# Patient Record
Sex: Male | Born: 1975 | Race: Black or African American | Hispanic: No | Marital: Married | Smoking: Never smoker
Health system: Southern US, Community
[De-identification: ages and names within clinical notes are randomized; demographics above are authoritative.]

---

## 2006-11-30 ENCOUNTER — Emergency Department (HOSPITAL_COMMUNITY): Admission: EM | Admit: 2006-11-30 | Discharge: 2006-11-30 | Payer: Self-pay | Admitting: Emergency Medicine

## 2008-11-04 IMAGING — CR DG CHEST 2V
2 series · 2 of 2 positions shown · non-contrast
Comparison: No comparison.

CLINICAL DATA: Fever and chills.
 CHEST - 2 VIEW:

[w chest pa]
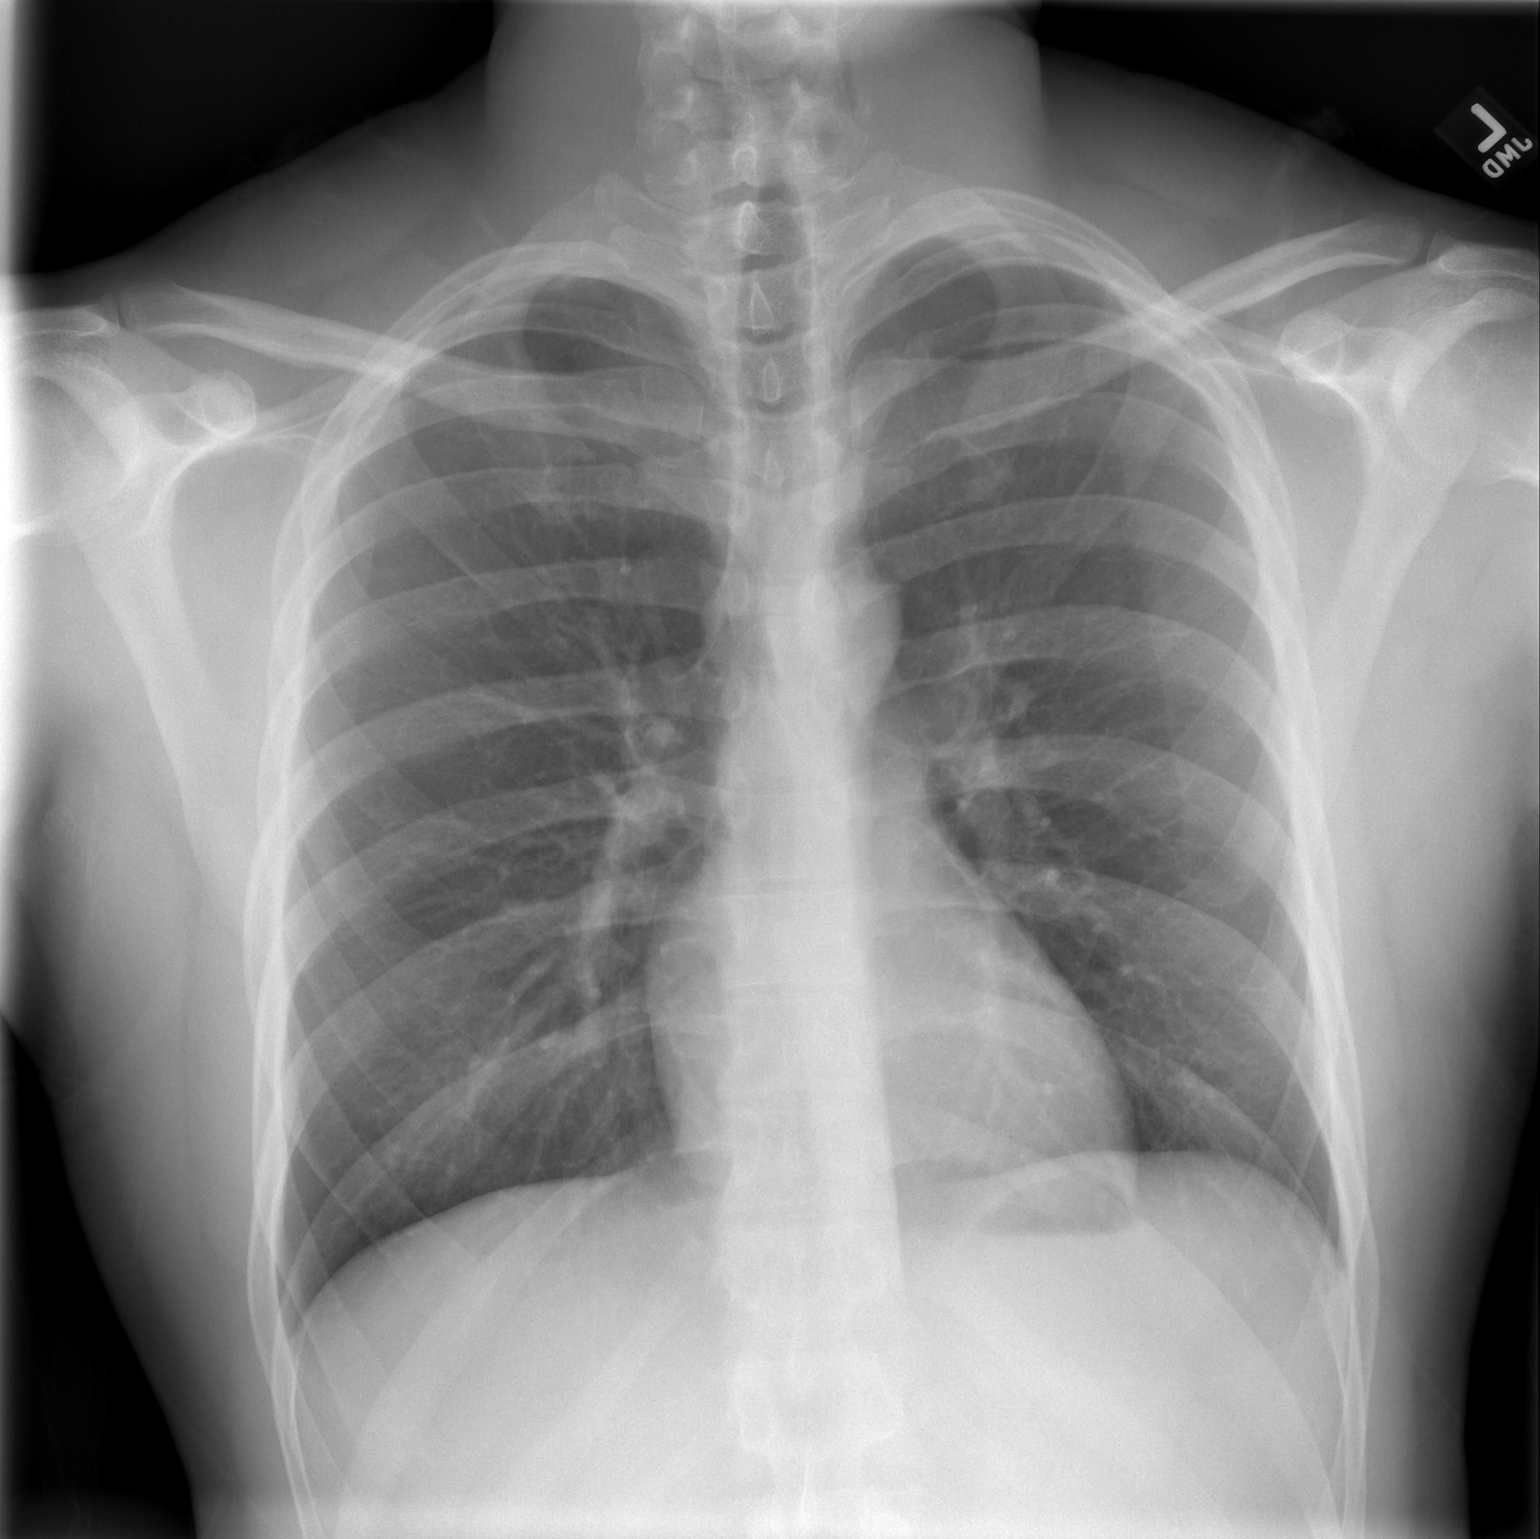

[w chest lat]
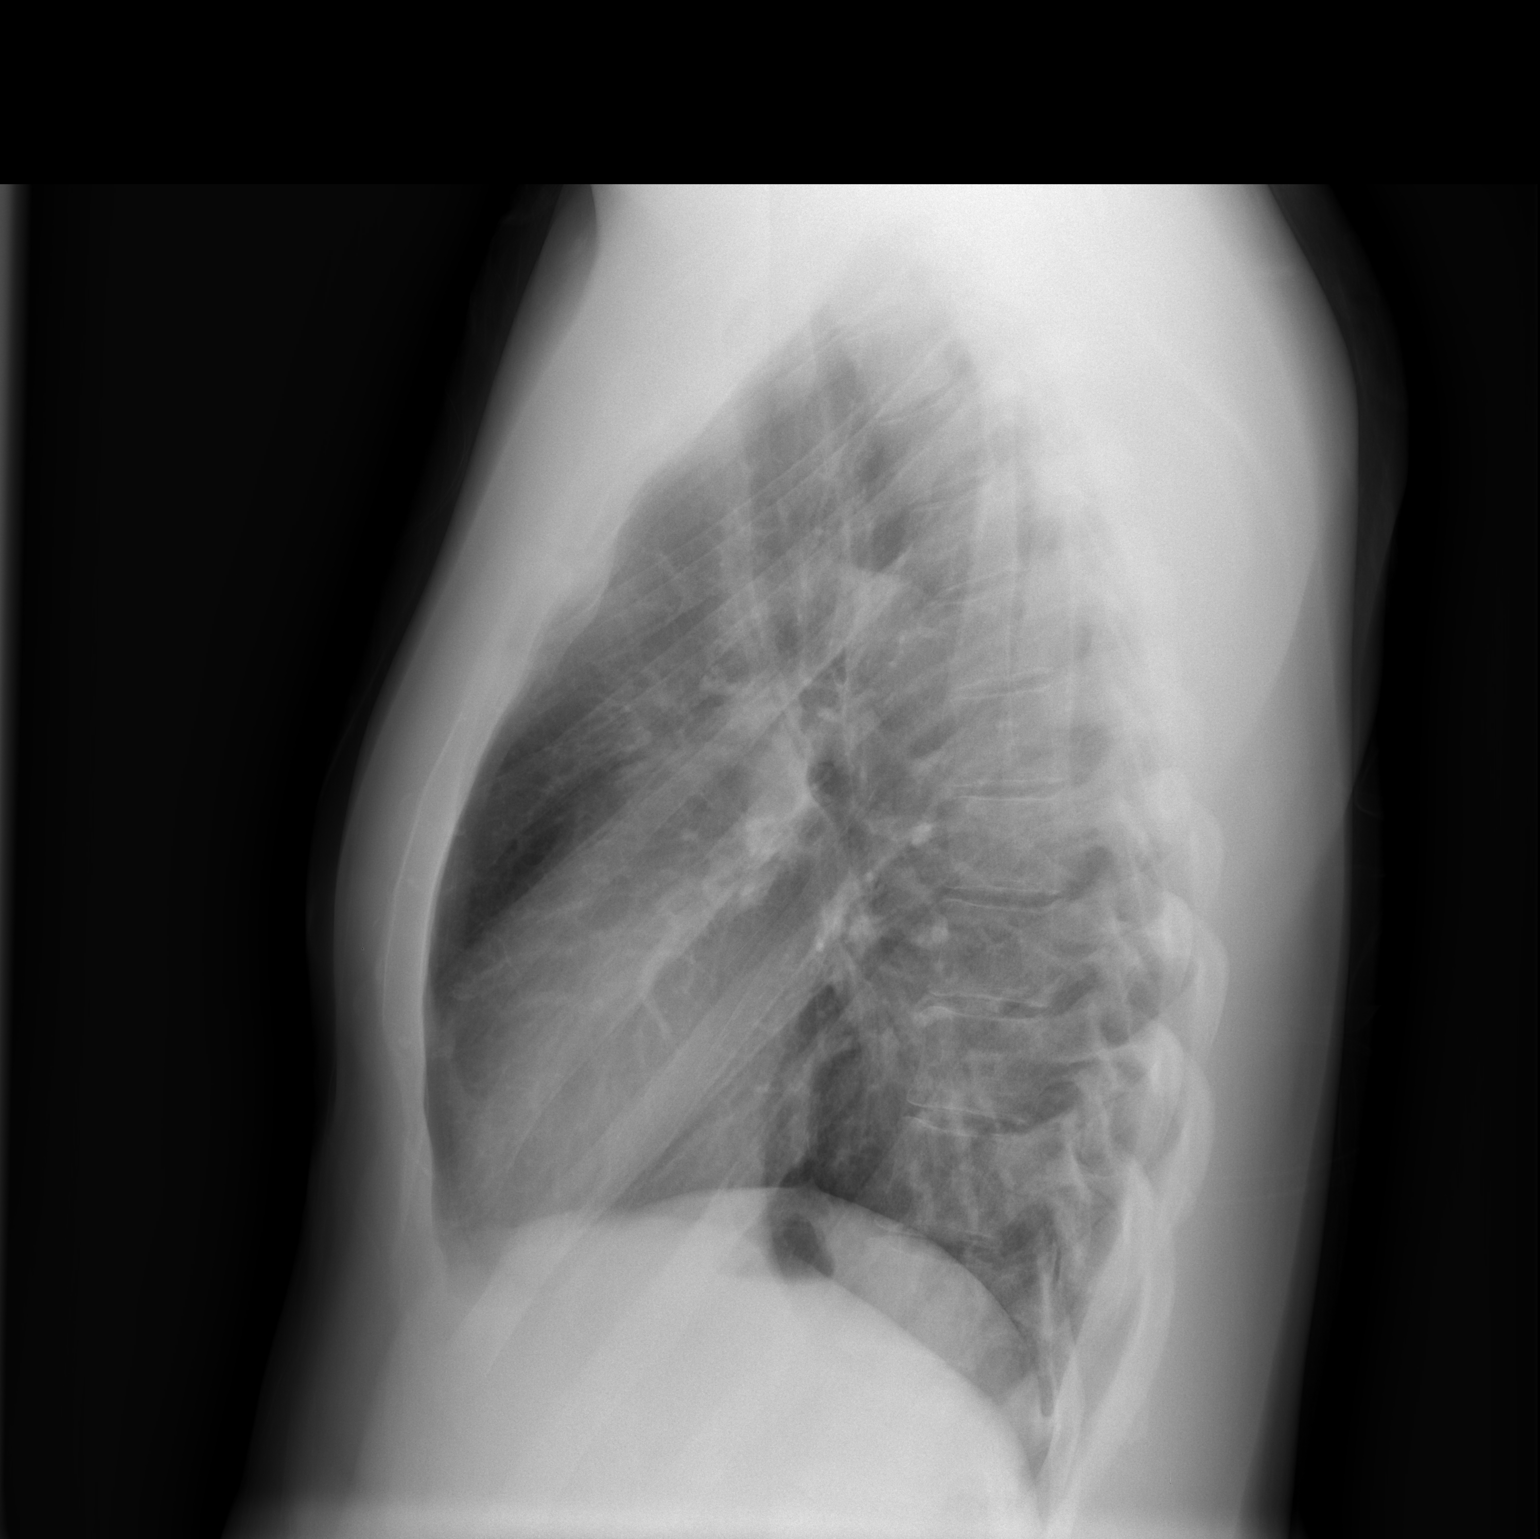

[2 of 2 positions shown; findings below may reference images not displayed]

FINDINGS: No infiltrate, congestive heart failure, or pneumothorax.  Rounded structure projecting over right lower lung zone between the anterior aspect of the 5th and 6th rib may represent nipple shadow.  This can be confirmed with nipple marker views.
IMPRESSION: 1.  No infiltrate, congestive heart failure, or pneumothorax. 
 2.  Question nipple shadow right lung base.  Repeat PA view with nipple markers in place recommended.

## 2010-07-09 ENCOUNTER — Emergency Department (HOSPITAL_COMMUNITY)
Admission: EM | Admit: 2010-07-09 | Discharge: 2010-07-09 | Disposition: A | Discharge status: Home / Self Care | Payer: BC Managed Care – PPO | Attending: Emergency Medicine | Admitting: Emergency Medicine

## 2010-07-09 DIAGNOSIS — G4700 Insomnia, unspecified: Secondary | ICD-10-CM | POA: Insufficient documentation

## 2010-07-09 DIAGNOSIS — F411 Generalized anxiety disorder: Secondary | ICD-10-CM | POA: Insufficient documentation

## 2010-07-09 DIAGNOSIS — I10 Essential (primary) hypertension: Secondary | ICD-10-CM | POA: Insufficient documentation

## 2010-07-09 LAB — DIFFERENTIAL
Basophils Absolute: 0 10*3/uL (ref 0.0–0.1)
Basophils Relative: 0 % (ref 0–1)
Eosinophils Absolute: 0.1 10*3/uL (ref 0.0–0.7)
Lymphocytes Relative: 25 % (ref 12–46)
Monocytes Absolute: 0.6 10*3/uL (ref 0.1–1.0)

## 2010-07-09 LAB — CBC
MCV: 85.3 fL (ref 78.0–100.0)
Platelets: 263 10*3/uL (ref 150–400)
RDW: 13.3 % (ref 11.5–15.5)
WBC: 6.3 10*3/uL (ref 4.0–10.5)

## 2010-07-09 LAB — COMPREHENSIVE METABOLIC PANEL
AST: 15 U/L (ref 0–37)
CO2: 28 mEq/L (ref 19–32)
Chloride: 104 mEq/L (ref 96–112)
GFR calc non Af Amer: >60 mL/min (ref >60)
Glucose, Bld: 108 mg/dL — ABNORMAL HIGH (ref 70–99)
Potassium: 3.6 mEq/L (ref 3.5–5.1)
Total Protein: 6.9 g/dL (ref 6.0–8.3)

## 2010-07-09 LAB — URINALYSIS, ROUTINE W REFLEX MICROSCOPIC: Hgb urine dipstick: NEGATIVE

## 2010-07-09 LAB — GLUCOSE, CAPILLARY: Glucose-Capillary: 116 mg/dL — ABNORMAL HIGH (ref 70–99)

## 2010-08-15 ENCOUNTER — Emergency Department (HOSPITAL_COMMUNITY)
Admission: EM | Admit: 2010-08-15 | Discharge: 2010-08-16 | Disposition: A | Discharge status: Home / Self Care | Payer: BC Managed Care – PPO | Attending: Emergency Medicine | Admitting: Emergency Medicine

## 2010-08-15 DIAGNOSIS — Z049 Encounter for examination and observation for unspecified reason: Secondary | ICD-10-CM | POA: Insufficient documentation

## 2010-08-15 DIAGNOSIS — F3289 Other specified depressive episodes: Secondary | ICD-10-CM | POA: Insufficient documentation

## 2010-08-15 DIAGNOSIS — I10 Essential (primary) hypertension: Secondary | ICD-10-CM | POA: Insufficient documentation

## 2010-08-15 DIAGNOSIS — F329 Major depressive disorder, single episode, unspecified: Secondary | ICD-10-CM | POA: Insufficient documentation

## 2010-08-15 DIAGNOSIS — R42 Dizziness and giddiness: Secondary | ICD-10-CM | POA: Insufficient documentation

## 2010-08-15 LAB — CBC
HCT: 43.1 % (ref 39.0–52.0)
MCHC: 35.3 g/dL (ref 30.0–36.0)
Platelets: 254 10*3/uL (ref 150–400)
RBC: 5.02 MIL/uL (ref 4.22–5.81)
RDW: 13.4 % (ref 11.5–15.5)

## 2010-08-15 LAB — DIFFERENTIAL
Basophils Relative: 1 % (ref 0–1)
Eosinophils Absolute: 0.2 10*3/uL (ref 0.0–0.7)
Lymphocytes Relative: 28 % (ref 12–46)
Monocytes Absolute: 0.9 10*3/uL (ref 0.1–1.0)

## 2010-08-16 LAB — BASIC METABOLIC PANEL
CO2: 27 mEq/L (ref 19–32)
Chloride: 103 mEq/L (ref 96–112)
Creatinine, Ser: 1.29 mg/dL (ref 0.50–1.35)
Potassium: 3.8 mEq/L (ref 3.5–5.1)
Sodium: 139 mEq/L (ref 135–145)

## 2010-11-22 LAB — URINALYSIS, ROUTINE W REFLEX MICROSCOPIC
Bilirubin Urine: NEGATIVE
Glucose, UA: NEGATIVE
Ketones, ur: NEGATIVE
Protein, ur: NEGATIVE
Specific Gravity, Urine: 1.026
Urobilinogen, UA: 1

## 2012-07-27 ENCOUNTER — Ambulatory Visit (INDEPENDENT_AMBULATORY_CARE_PROVIDER_SITE_OTHER): Payer: BC Managed Care – PPO | Admitting: Internal Medicine

## 2012-07-27 VITALS — BP 135/79 | HR 43 | Temp 98.2°F | Resp 16 | Ht 73.0 in | Wt 199.0 lb

## 2012-07-27 DIAGNOSIS — L259 Unspecified contact dermatitis, unspecified cause: Secondary | ICD-10-CM

## 2012-07-27 MED ORDER — PREDNISONE 20 MG PO TABS: ORAL_TABLET | ORAL | Status: DC

## 2012-07-27 NOTE — Progress Notes (Signed)
  Subjective:    Patient ID: Luis Martin, male    DOB: Oct 15, 1975, 37 y.o.   MRN: [id number]  HPI pruritic rash over arms legs feet hands and neck No outdoor exposures No new drugs No unusual foods New body wash  No fever or illness    Review of Systems     Objective:   Physical Exam Erythematous papulovesicular lesions in patches and singular vulvar entire body with most intense focus on the right arm No lymphadenopathy No cellulitis       Assessment & Plan:  Contact dermatitis Meds ordered this encounter  Medications  . predniSONE (DELTASONE) 20 MG tablet    Sig: 3/3/2/2/1/1 single daily dose for 6 days    Dispense:  12 tablet    Refill:  0   Discontinue body wash

## 2013-04-01 ENCOUNTER — Ambulatory Visit (INDEPENDENT_AMBULATORY_CARE_PROVIDER_SITE_OTHER): Payer: BC Managed Care – PPO | Admitting: Family Medicine

## 2013-04-01 VITALS — BP 120/80 | HR 51 | Temp 97.4°F | Resp 18 | Ht 71.75 in | Wt 204.0 lb

## 2013-04-01 DIAGNOSIS — J029 Acute pharyngitis, unspecified: Secondary | ICD-10-CM

## 2013-04-01 DIAGNOSIS — J329 Chronic sinusitis, unspecified: Secondary | ICD-10-CM

## 2013-04-01 LAB — POCT RAPID STREP A (OFFICE): Rapid Strep A Screen: NEGATIVE

## 2013-04-01 MED ORDER — AZITHROMYCIN 250 MG PO TABS: ORAL_TABLET | ORAL | Status: DC

## 2013-04-01 NOTE — Patient Instructions (Addendum)
Drink plenty of fluids  Take azithromycin 2 today then one daily  Return if worse.

## 2013-04-01 NOTE — Progress Notes (Signed)
Subjective: 38 year old man who is here with a sore throat for the past 2 days. He has some congestion in his left side of his face and head mostly. Not a lot of other symptoms. He denies dental problems. He has not been blowing a lot of his nose or coughing up a lot. He has not had any swollen glands. No fever.  Objective: TMs normal. Throat has some swelling of the but no pus and not a lot of erythema. Chest is clear. Heart regular without murmurs. He does have a small node on the left side of his neck. He is not tender over his teeth but he does have a little tenderness over the maxillary and facial sinuses on the left.  Assessment: Pharyngitis and congestion and sinus tenderness  Plan: Strep or cough and see if that he go ahead and come over and monitor put on the eye and, over a he is  Results for orders placed in visit on 04/01/13  POCT RAPID STREP A (OFFICE)      Result Value Ref Range   Rapid Strep A Screen Negative  Negative

## 2013-04-03 LAB — CULTURE, GROUP A STREP: ORGANISM ID, BACTERIA: NORMAL

## 2014-05-12 ENCOUNTER — Ambulatory Visit (INDEPENDENT_AMBULATORY_CARE_PROVIDER_SITE_OTHER): Payer: BC Managed Care – PPO | Admitting: Physician Assistant

## 2014-05-12 VITALS — BP 114/82 | HR 61 | Temp 97.8°F | Resp 16 | Ht 72.5 in | Wt 210.0 lb

## 2014-05-12 DIAGNOSIS — J309 Allergic rhinitis, unspecified: Secondary | ICD-10-CM

## 2014-05-12 MED ORDER — FLUTICASONE PROPIONATE 50 MCG/ACT NA SUSP: 2.0000 | Freq: Every day | NASAL | Status: AC

## 2014-05-12 MED ORDER — CETIRIZINE HCL 10 MG PO TABS: 10.0000 mg | ORAL_TABLET | Freq: Every day | ORAL | Status: AC

## 2014-05-12 NOTE — Patient Instructions (Signed)
Take zyrtec nightly for the next 2 weeks. Use flonase every morning. In a few weeks you can come off of these meds and see if you still need them. If you do, you can continue for as long as you need. You will have refills at the pharmacy. Return if not getting better in 2 weeks.

## 2014-05-12 NOTE — Progress Notes (Signed)
  Medical screening examination/treatment/procedure(s) were performed by non-physician practitioner and as supervising physician I was immediately available for consultation/collaboration.     

## 2014-05-12 NOTE — Progress Notes (Signed)
Subjective:    Patient ID: Luis NiemannMarland Gentry, male    DOB: Aug 22, 1975, 39 y.o.   MRN: [id number]  HPI  This is a 39 year old male who is presenting with sore throat x 2 days. Has had some sneezing and post-nasal drip. Denies nasal congestion, cough, fever, chills. Notes that he has had more problems with environmental allergies in the past 2 years. Hasn't taken anything for his current symptoms. No sick contacts. No problems with asthma and he is not a smoker.  Review of Systems  Constitutional: Negative for fever and chills.  HENT: Positive for postnasal drip, sneezing and sore throat. Negative for congestion, ear pain and sinus pressure.   Eyes: Negative for pain.  Respiratory: Negative for cough.   Gastrointestinal: Negative for nausea, vomiting, abdominal pain and diarrhea.  Skin: Negative for rash.  Allergic/Immunologic: Positive for environmental allergies.  Neurological: Negative for headaches.  Hematological: Negative for adenopathy.    There are no active problems to display for this patient.  Prior to Admission medications   Not on File   No Known Allergies  Patient's social and family history were reviewed.     Objective:   Physical Exam  Constitutional: He is oriented to person, place, and time. He appears well-developed and well-nourished. No distress.  HENT:  Head: Normocephalic and atraumatic.  Right Ear: Hearing, tympanic membrane, external ear and ear canal normal.  Left Ear: Hearing, tympanic membrane, external ear and ear canal normal.  Nose: Nose normal. Right sinus exhibits no maxillary sinus tenderness and no frontal sinus tenderness. Left sinus exhibits no maxillary sinus tenderness and no frontal sinus tenderness.  Mouth/Throat: Uvula is midline and mucous membranes are normal. Posterior oropharyngeal erythema present. No oropharyngeal exudate or posterior oropharyngeal edema.  Eyes: Conjunctivae and lids are normal. Right eye exhibits no discharge. Left  eye exhibits no discharge. No scleral icterus.  Cardiovascular: Normal rate, regular rhythm, normal heart sounds, intact distal pulses and normal pulses.   No murmur heard. Pulmonary/Chest: Effort normal and breath sounds normal. No respiratory distress. He has no wheezes. He has no rhonchi. He has no rales.  Musculoskeletal: Normal range of motion.  Lymphadenopathy:       Head (right side): No submental, no submandibular and no tonsillar adenopathy present.       Head (left side): No submental, no submandibular and no tonsillar adenopathy present.    He has no cervical adenopathy.  Neurological: He is alert and oriented to person, place, and time.  Skin: Skin is warm, dry and intact. No lesion and no rash noted.  Psychiatric: He has a normal mood and affect. His speech is normal and behavior is normal. Thought content normal.   BP 114/82 mmHg  Pulse 61  Temp(Src) 97.8 F (36.6 C) (Oral)  Resp 16  Ht 6' 0.5" (1.842 m)  Wt 210 lb (95.255 kg)  BMI 28.07 kg/m2  SpO2 97%     Assessment & Plan:  1. Allergic rhinitis, unspecified allergic rhinitis type Pt's symptoms are consistent with allergies. He will take zyrtec QHS and flonase QAM. He will return if symptoms worsen or fail to improving in 10-14 days.  - cetirizine (ZYRTEC) 10 MG tablet; Take 1 tablet (10 mg total) by mouth daily.  Dispense: 30 tablet; Refill: 6 - fluticasone (FLONASE) 50 MCG/ACT nasal spray; Place 2 sprays into both nostrils daily.  Dispense: 16 g; Refill: 6   Nicole V. Dyke BrackettBush, PA-C, [facility]  05/12/2014  

## 2014-05-13 ENCOUNTER — Telehealth: Payer: Self-pay

## 2014-05-13 NOTE — Telephone Encounter (Signed)
Patients spouse lathelia  Called to check the status to see if any medication will be called in for her husband. Per spouse patients throat is red,swollen, and very difficult to swollow/pain. She stated he has also started running a fever and feels he has strep throat. He was  Here yesterday and was told it was allergies but patient feels its strep throat. [facility] [address] and call back number is [phone] for patient.

## 2014-05-13 NOTE — Telephone Encounter (Signed)
Pt's sx's have worsened: see previous phone note. Is there anything we need to rx him?

## 2014-05-13 NOTE — Telephone Encounter (Signed)
Assessment & Plan:  1. Allergic rhinitis, unspecified allergic rhinitis type Pt's symptoms are consistent with allergies. He will take zyrtec QHS and flonase QAM. He will return if symptoms worsen or fail to improving in 10-14 days.  - cetirizine (ZYRTEC) 10 MG tablet; Take 1 tablet (10 mg total) by mouth daily. Dispense: 30 tablet; Refill: 6 - fluticasone (FLONASE) 50 MCG/ACT nasal spray; Place 2 sprays into both nostrils daily. Dispense: 16 g; Refill: 6   Nicole V. Dyke BrackettBush, PA-C, [facility]  05/12/2014       Joni Reiningicole, you just saw her yesterday. Please advise.

## 2014-05-13 NOTE — Telephone Encounter (Signed)
Requesting antibiotic for sore throat   [facility] [address] [phone] (H)

## 2014-05-14 NOTE — Telephone Encounter (Signed)
Patient came in today and was told to be seen. He left because of the wait.

## 2014-05-14 NOTE — Telephone Encounter (Signed)
Pt's symptoms and exam were consistent with allergies or viral illness. Antibiotics would not help with these symptoms. He should continue the medications I prescribed. If symptoms are not improving in next 5 days or if symptoms have worsened and he feels he needs different treatment, he should RTC for further evaluation.

## 2019-04-12 ENCOUNTER — Ambulatory Visit: Payer: Self-pay | Attending: Internal Medicine

## 2019-04-12 ENCOUNTER — Other Ambulatory Visit: Payer: Self-pay

## 2019-04-12 DIAGNOSIS — Z23 Encounter for immunization: Secondary | ICD-10-CM | POA: Insufficient documentation

## 2019-04-12 NOTE — Progress Notes (Signed)
   Covid-19 Vaccination Clinic  Name:  Luis Martin    MRN: [id number] DOB: July 16, 1975  04/12/2019  [name] was observed post Covid-19 immunization for 15 minutes without incidence. He was provided with Vaccine Information Sheet and instruction to access the V-Safe system.   [name] was instructed to call 911 with any severe reactions post vaccine: Careem Kitchen Difficulty breathing  . Swelling of your face and throat  . A fast heartbeat  . A bad rash all over your body  . Dizziness and weakness    Immunizations Administered    Name Date Dose VIS Date Route   Moderna COVID-19 Vaccine 04/12/2019  1:45 PM 0.5 mL 01/13/2019 Intramuscular   Manufacturer: Moderna   Lot: [id number]   NDC: 16384-536-46

## 2019-05-16 ENCOUNTER — Ambulatory Visit: Payer: Self-pay | Attending: Internal Medicine

## 2019-05-16 DIAGNOSIS — Z23 Encounter for immunization: Secondary | ICD-10-CM

## 2019-05-16 NOTE — Progress Notes (Signed)
   Covid-19 Vaccination Clinic  Name:  Carole Deere    MRN: [id number] DOB: January 26, 1976  05/16/2019  Mr. Wainwright was observed post Covid-19 immunization for 15 minutes without incident. He was provided with Vaccine Information Sheet and instruction to access the V-Safe system.   Mr. Matuszak was instructed to call 911 with any severe reactions post vaccine: Sacha Kitchen Difficulty breathing  . Swelling of face and throat  . A fast heartbeat  . A bad rash all over body  . Dizziness and weakness   Immunizations Administered    Name Date Dose VIS Date Route   Moderna COVID-19 Vaccine 05/16/2019  1:37 PM 0.5 mL 01/13/2019 Intramuscular   Manufacturer: Moderna   Lot: [id number]   NDC: 76151-834-37
